# Patient Record
Sex: Female | Born: 1950 | Race: Black or African American | Hispanic: No
Health system: Southern US, Community
[De-identification: ages and names within clinical notes are randomized; demographics above are authoritative.]

## PROBLEM LIST (undated history)

## (undated) HISTORY — PX: ABDOMINAL HYSTERECTOMY: SHX81

---

## 2004-10-02 ENCOUNTER — Emergency Department (HOSPITAL_COMMUNITY): Admission: EM | Admit: 2004-10-02 | Discharge: 2004-10-02 | Payer: Self-pay | Admitting: Emergency Medicine

## 2018-06-27 ENCOUNTER — Emergency Department (HOSPITAL_COMMUNITY)
Admission: EM | Admit: 2018-06-27 | Discharge: 2018-06-27 | Disposition: A | Discharge status: Home / Self Care | Payer: Medicare Other | Attending: Emergency Medicine | Admitting: Emergency Medicine

## 2018-06-27 DIAGNOSIS — I10 Essential (primary) hypertension: Secondary | ICD-10-CM | POA: Diagnosis not present

## 2018-06-27 DIAGNOSIS — M109 Gout, unspecified: Secondary | ICD-10-CM | POA: Insufficient documentation

## 2018-06-27 DIAGNOSIS — M79674 Pain in right toe(s): Secondary | ICD-10-CM | POA: Diagnosis present

## 2018-06-27 MED ORDER — HYDROCODONE-ACETAMINOPHEN 5-325 MG PO TABS
1.0000 | ORAL_TABLET | Freq: Once | ORAL | Status: AC
  Administered 2018-06-27: 1 via ORAL
  Filled 2018-06-27: qty 1

## 2018-06-27 MED ORDER — PREDNISONE 20 MG PO TABS: ORAL_TABLET | ORAL | 0 refills | Status: AC

## 2018-06-27 MED ORDER — HYDROCODONE-ACETAMINOPHEN 5-325 MG PO TABS: 1.0000 | ORAL_TABLET | Freq: Four times a day (QID) | ORAL | 0 refills | Status: DC | PRN

## 2018-06-27 NOTE — ED Triage Notes (Signed)
Pt complaining of right foot pain, starting last night

## 2018-06-27 NOTE — ED Provider Notes (Signed)
[facility]-EMERGENCY DEPT Provider Note   CSN: [id number] Arrival date & time: 06/27/18  [id number]     History   Chief Complaint No chief complaint on file.   HPI Kara Poole is a 67 y.o. female.  HPI  67 year old female with a history of hypertension presents with acute right toe pain.  No trauma noted.  Started last night.  Patient's pain is rated as a 10/10.  The patient has noticed some swelling without color change.  No fevers.  Patient has had this once before and was diagnosed with gout.  States this feels exactly the same.  No known history of diabetes or kidney problems.  No past medical history on file.  There are no active problems to display for this patient.      OB History   None      Home Medications    Prior to Admission medications   Medication Sig Start Date End Date Taking? Authorizing Provider  HYDROcodone-acetaminophen (NORCO) 5-325 MG tablet Take 1 tablet by mouth every 6 (six) hours as needed for severe pain. 06/27/18   Pricilla LovelessGoldston, Niema Carrara, MD  predniSONE (DELTASONE) 20 MG tablet 3 tabs po day one, then 2 po daily x 4 days 06/27/18   Pricilla LovelessGoldston, Kary Colaizzi, MD    Family History No family history on file.  Social History Social History   Tobacco Use  . Smoking status: Not on file  Substance Use Topics  . Alcohol use: Not on file  . Drug use: Not on file     Allergies   Patient has no allergy information on record.   Review of Systems Review of Systems  Constitutional: Negative for fever.  Musculoskeletal: Positive for arthralgias and joint swelling.  Skin: Negative for color change.     Physical Exam Updated Vital Signs BP (!) 159/86   Pulse 66   Temp 98.5 F (36.9 C)   Resp 16   SpO2 100%   Physical Exam  Constitutional: She appears well-developed and well-nourished. No distress.  HENT:  Head: Normocephalic and atraumatic.  Right Ear: External ear normal.  Left Ear: External ear normal.  Nose: Nose  normal.  Eyes: Right eye exhibits no discharge. Left eye exhibits no discharge.  Cardiovascular: Normal rate and regular rhythm.  Pulses:      Dorsalis pedis pulses are 2+ on the right side, and 2+ on the left side.  Pulmonary/Chest: Effort normal.  Musculoskeletal:       Right ankle: She exhibits normal range of motion and no swelling. No tenderness.       Right foot: There is tenderness and swelling.       Feet:  Neurological: She is alert.  Skin: Skin is warm and dry. She is not diaphoretic. No erythema.  Nursing note and vitals reviewed.    ED Treatments / Results  Labs (all labs ordered are listed, but only abnormal results are displayed) Labs Reviewed - No data to display  EKG None  Radiology No results found.  Procedures Procedures (including critical care time)  Medications Ordered in ED Medications  HYDROcodone-acetaminophen (NORCO/VICODIN) 5-325 MG per tablet 1 tablet (has no administration in time range)     Initial Impression / Assessment and Plan / ED Course  I have reviewed the triage vital signs and the nursing notes.  Pertinent labs & imaging results that were available during my care of the patient were reviewed by me and considered in my medical decision making (see chart for details).  Presentation is consistent with gout which she has had before.  We discussed how this is primarily uric acid problem and will give information on trying to change diet.  She is noted to be hypertensive and does not have a PCP here yet.  She will need to follow-up with 1 both for follow-up of the gout as well as normal health maintenance.  However my suspicion for any acute trauma requiring x-ray is low given no reported trauma.  This is unlikely to be a septic joint given no erythema and good range of motion of the joint.  Given her age, I think NSAIDs would not be a good idea, as well as colchicine given no known renal baseline.  Treat with Norco for severe pain as  well as a prednisone burst.  Final Clinical Impressions(s) / ED Diagnoses   Final diagnoses:  Acute gout involving toe of right foot, unspecified cause    ED Discharge Orders         Ordered    HYDROcodone-acetaminophen (NORCO) 5-325 MG tablet  Every 6 hours PRN     06/27/18 0920    predniSONE (DELTASONE) 20 MG tablet     06/27/18 0920           Pricilla Loveless, MD 06/27/18 [phone]

## 2020-05-09 ENCOUNTER — Emergency Department (HOSPITAL_COMMUNITY): Payer: Medicare Other

## 2020-05-09 ENCOUNTER — Other Ambulatory Visit: Payer: Self-pay

## 2020-05-09 ENCOUNTER — Emergency Department (HOSPITAL_COMMUNITY)
Admission: EM | Admit: 2020-05-09 | Discharge: 2020-05-09 | Disposition: A | Discharge status: Home / Self Care | Payer: Medicare Other | Attending: Emergency Medicine | Admitting: Emergency Medicine

## 2020-05-09 DIAGNOSIS — M779 Enthesopathy, unspecified: Secondary | ICD-10-CM | POA: Insufficient documentation

## 2020-05-09 DIAGNOSIS — M6790 Unspecified disorder of synovium and tendon, unspecified site: Secondary | ICD-10-CM

## 2020-05-09 DIAGNOSIS — M25571 Pain in right ankle and joints of right foot: Secondary | ICD-10-CM | POA: Diagnosis not present

## 2020-05-09 DIAGNOSIS — M79671 Pain in right foot: Secondary | ICD-10-CM | POA: Diagnosis present

## 2020-05-09 MED ORDER — HYDROCODONE-ACETAMINOPHEN 5-325 MG PO TABS: 1.0000 | ORAL_TABLET | Freq: Four times a day (QID) | ORAL | 0 refills | Status: DC | PRN

## 2020-05-09 MED ORDER — IBUPROFEN 400 MG PO TABS: 400.0000 mg | ORAL_TABLET | Freq: Three times a day (TID) | ORAL | 0 refills | Status: DC | PRN

## 2020-05-09 MED ORDER — ACETAMINOPHEN ER 650 MG PO TBCR: 650.0000 mg | EXTENDED_RELEASE_TABLET | Freq: Three times a day (TID) | ORAL | 0 refills | Status: AC | PRN

## 2020-05-09 MED ORDER — OXYCODONE-ACETAMINOPHEN 5-325 MG PO TABS
1.0000 | ORAL_TABLET | Freq: Once | ORAL | Status: AC
  Administered 2020-05-09: 1 via ORAL
  Filled 2020-05-09: qty 1

## 2020-05-09 NOTE — ED Notes (Signed)
Ambulatory with crutches

## 2020-05-09 NOTE — ED Triage Notes (Signed)
Patient reports she has gout in her right foot and that the pain has gotten increasingly worse

## 2020-05-09 NOTE — Discharge Instructions (Addendum)
We saw in the ER for pain in your ankle.  It is unclear if you are having gout versus tendinitis.  Given your severe pain making it difficult to walk, we have placed you in a cam walker boot.  We are sending you home with strong pain medication that needs to be taken only if the pain is excruciating and not responding to Tylenol and ibuprofen.  Follow-up with the orthopedic doctors in 1 week.

## 2020-05-09 NOTE — ED Notes (Signed)
Patient called x2 for rooming

## 2020-05-09 NOTE — ED Provider Notes (Signed)
[facility]-EMERGENCY DEPT Provider Note   CSN: [id number] Arrival date & time: 05/09/20  1048     History Chief Complaint  Patient presents with  . Foot Pain    Right    Kara Poole is a 69 y.o. female.  HPI    69 year old female comes in a chief complaint of right foot and ankle pain.  Patient reports that her pain started 4 to 5 days ago, it is progressively gotten worse.  She is having severe pain every time she puts pressure on that side now, and therefore unable to walk without assistance.  Normally she is able to walk around without any assistance and is quite active.  She had history of gout in her knee earlier, and suspects that this is gout again.  She denies any nausea, vomiting, fevers, chills.  No trauma.   No past medical history on file.  There are no problems to display for this patient.     OB History   No obstetric history on file.     No family history on file.  Social History   Tobacco Use  . Smoking status: Not on file  Substance Use Topics  . Alcohol use: Not on file  . Drug use: Not on file    Home Medications Prior to Admission medications   Medication Sig Start Date End Date Taking? Authorizing Provider  acetaminophen (TYLENOL 8 HOUR) 650 MG CR tablet Take 1 tablet (650 mg total) by mouth every 8 (eight) hours as needed. 05/09/20   Derwood Kaplan, MD  HYDROcodone-acetaminophen (NORCO/VICODIN) 5-325 MG tablet Take 1 tablet by mouth every 6 (six) hours as needed for moderate pain or severe pain. 05/09/20   Fayrene Helper, PA-C  ibuprofen (ADVIL) 400 MG tablet Take 1 tablet (400 mg total) by mouth every 8 (eight) hours as needed. 05/09/20   Derwood Kaplan, MD  predniSONE (DELTASONE) 20 MG tablet 3 tabs po day one, then 2 po daily x 4 days 06/27/18   Pricilla Loveless, MD    Allergies    Patient has no allergy information on record.  Review of Systems   Review of Systems  Constitutional: Positive for activity change.  Negative for fever.  Musculoskeletal: Positive for arthralgias, gait problem and myalgias.  Skin: Negative for wound.  Allergic/Immunologic: Negative for immunocompromised state.    Physical Exam Updated Vital Signs BP (!) 179/80 (BP Location: Right Arm)   Pulse 89   Temp 99.3 F (37.4 C) (Oral)   Resp 16   SpO2 100%   Physical Exam Vitals and nursing note reviewed.  Constitutional:      Appearance: She is well-developed.  HENT:     Head: Atraumatic.  Cardiovascular:     Rate and Rhythm: Normal rate.  Pulmonary:     Effort: Pulmonary effort is normal.  Abdominal:     General: Bowel sounds are normal.  Musculoskeletal:        General: Swelling and tenderness present. No deformity.     Cervical back: Normal range of motion and neck supple.     Comments: Patient has tenderness over the right Achilles tendon insertion site.  She also has tenderness over her lateral and medial malleoli.  Positive tenderness with passive range of motion of the ankle.  No erythema or warmth to touch.  No tenderness over the calf, knee.  Skin:    General: Skin is warm and dry.  Neurological:     Mental Status: She is alert and oriented  to person, place, and time.     ED Results / Procedures / Treatments   Labs (all labs ordered are listed, but only abnormal results are displayed) Labs Reviewed - No data to display  EKG None  Radiology DG Ankle Complete Right  Result Date: 05/09/2020 CLINICAL DATA:  Ankle pain EXAM: RIGHT ANKLE - COMPLETE 3+ VIEW COMPARISON:  None. FINDINGS: No acute fracture or dislocation is noted. Degenerative changes of the tarsal bones are noted. Calcaneal spurring is seen. Mild soft tissue swelling is noted. IMPRESSION: Soft tissue swelling and degenerative change without acute abnormality. Electronically Signed   By: Alcide Clever M.D.   On: 05/09/2020 13:31    Procedures Procedures (including critical care time)  Medications Ordered in ED Medications    oxyCODONE-acetaminophen (PERCOCET/ROXICET) 5-325 MG per tablet 1 tablet (1 tablet Oral Given 05/09/20 1311)    ED Course  I have reviewed the triage vital signs and the nursing notes.  Pertinent labs & imaging results that were available during my care of the patient were reviewed by me and considered in my medical decision making (see chart for details).    MDM Rules/Calculators/A&P                          Patient comes in a chief complaint of ankle pain. Clinical suspicion is high for Achilles tendinopathy.  There is also tenderness over the ankle, that could be arthritis related.  Doubt gout or septic arthritis based on exam.  Patient also indicates that she is having the same type of pain in her left side, which makes arthropathy even less likely.  Patient was placed in a boot.  X-ray does not show any concerning findings.  Patient ambulating with the boot.  We will advised her to follow-up with orthopedic surgery.  She is seeing her PCP later this week as well,  which is helpful.  Final Clinical Impression(s) / ED Diagnoses Final diagnoses:  Acute right ankle pain  Tendinopathy    Rx / DC Orders ED Discharge Orders         Ordered    HYDROcodone-acetaminophen (NORCO/VICODIN) 5-325 MG tablet  Every 6 hours PRN     Discontinue  Reprint     05/09/20 1440    ibuprofen (ADVIL) 400 MG tablet  Every 8 hours PRN     Discontinue  Reprint     05/09/20 1440    acetaminophen (TYLENOL 8 HOUR) 650 MG CR tablet  Every 8 hours PRN     Discontinue  Reprint     05/09/20 1440           Derwood Kaplan, MD 05/09/20 1445

## 2021-05-16 ENCOUNTER — Encounter (HOSPITAL_COMMUNITY): Payer: Self-pay

## 2021-05-16 ENCOUNTER — Emergency Department (HOSPITAL_COMMUNITY): Payer: Medicare Other

## 2021-05-16 ENCOUNTER — Emergency Department (HOSPITAL_COMMUNITY)
Admission: EM | Admit: 2021-05-16 | Discharge: 2021-05-16 | Disposition: A | Discharge status: Home / Self Care | Payer: Medicare Other | Attending: Emergency Medicine | Admitting: Emergency Medicine

## 2021-05-16 ENCOUNTER — Other Ambulatory Visit: Payer: Self-pay

## 2021-05-16 DIAGNOSIS — Y9389 Activity, other specified: Secondary | ICD-10-CM | POA: Diagnosis not present

## 2021-05-16 DIAGNOSIS — M7021 Olecranon bursitis, right elbow: Secondary | ICD-10-CM | POA: Diagnosis not present

## 2021-05-16 DIAGNOSIS — M25521 Pain in right elbow: Secondary | ICD-10-CM | POA: Diagnosis present

## 2021-05-16 MED ORDER — HYDROCODONE-ACETAMINOPHEN 5-325 MG PO TABS: 1.0000 | ORAL_TABLET | Freq: Four times a day (QID) | ORAL | 0 refills | Status: AC | PRN

## 2021-05-16 MED ORDER — IBUPROFEN 400 MG PO TABS: 400.0000 mg | ORAL_TABLET | Freq: Three times a day (TID) | ORAL | 0 refills | Status: AC | PRN

## 2021-05-16 NOTE — ED Provider Notes (Signed)
[facility]-EMERGENCY DEPT Provider Note   CSN: [id number] Arrival date & time: 05/16/21  1635     History Chief Complaint  Patient presents with   Elbow Pain    Kara Poole is a 70 y.o. female.  HPI  Patient presents to the ED for evaluation of right elbow pain.  Patient states she has had the symptoms for a week or 2.  She initially went to an urgent care and they gave her a shot.  Patient states the elbow persists and is not getting any better.  Patient feels that the elbow was normal.  It hurts for her to move around.  She denies any fevers or chills.  No recent falls or injuries.  History reviewed. No pertinent past medical history.  There are no problems to display for this patient.   History reviewed. No pertinent surgical history.   OB History   No obstetric history on file.     History reviewed. No pertinent family history.     Home Medications Prior to Admission medications   Medication Sig Start Date End Date Taking? Authorizing Provider  acetaminophen (TYLENOL 8 HOUR) 650 MG CR tablet Take 1 tablet (650 mg total) by mouth every 8 (eight) hours as needed. 05/09/20   Derwood Kaplan, MD  HYDROcodone-acetaminophen (NORCO/VICODIN) 5-325 MG tablet Take 1 tablet by mouth every 6 (six) hours as needed for moderate pain or severe pain. 05/16/21   Linwood Dibbles, MD  ibuprofen (ADVIL) 400 MG tablet Take 1 tablet (400 mg total) by mouth every 8 (eight) hours as needed. 05/16/21   Linwood Dibbles, MD  predniSONE (DELTASONE) 20 MG tablet 3 tabs po day one, then 2 po daily x 4 days 06/27/18   Pricilla Loveless, MD    Allergies    Patient has no known allergies.  Review of Systems   Review of Systems  All other systems reviewed and are negative.  Physical Exam Updated Vital Signs BP (!) 177/88 (BP Location: Left Arm)   Pulse 78   Temp 98.8 F (37.1 C) (Oral)   Resp 16   SpO2 100%   Physical Exam Vitals and nursing note reviewed.  Constitutional:       General: She is not in acute distress.    Appearance: She is well-developed.  HENT:     Head: Normocephalic and atraumatic.     Right Ear: External ear normal.     Left Ear: External ear normal.  Eyes:     General: No scleral icterus.       Right eye: No discharge.        Left eye: No discharge.     Conjunctiva/sclera: Conjunctivae normal.  Neck:     Trachea: No tracheal deviation.  Cardiovascular:     Rate and Rhythm: Normal rate.  Pulmonary:     Effort: Pulmonary effort is normal. No respiratory distress.     Breath sounds: No stridor.  Abdominal:     General: There is no distension.  Musculoskeletal:        General: No swelling or deformity.     Right elbow: No swelling or deformity. Decreased range of motion. Tenderness present.     Cervical back: Neck supple.  Skin:    General: Skin is warm and dry.     Findings: No rash.  Neurological:     Mental Status: She is alert.     Cranial Nerves: Cranial nerve deficit: no gross deficits.    ED Results / Procedures /  Treatments   Labs (all labs ordered are listed, but only abnormal results are displayed) Labs Reviewed - No data to display  EKG None  Radiology DG Elbow Complete Right  Result Date: 05/16/2021 CLINICAL DATA:  Acute right elbow pain.  No known injury. EXAM: RIGHT ELBOW - COMPLETE 3+ VIEW COMPARISON:  None. FINDINGS: There is no evidence of fracture, dislocation, or joint effusion. There is no evidence of arthropathy or other focal bone abnormality. Soft tissues are unremarkable. IMPRESSION: Negative. Electronically Signed   By: Lupita Raider M.D.   On: 05/16/2021 19:23    Procedures Procedures   Medications Ordered in ED Medications - No data to display  ED Course  I have reviewed the triage vital signs and the nursing notes.  Pertinent labs & imaging results that were available during my care of the patient were reviewed by me and considered in my medical decision making (see chart for  details).  Clinical Course as of 05/16/21 2020  Thu May 16, 2021  2005 X-ray without acute abnormality [JK]    Clinical Course User Index [JK] Linwood Dibbles, MD   MDM Rules/Calculators/A&P                           Patient presented to the ED for evaluation of right elbow pain.  Patient denies any recent injuries.  X-ray does not show any signs of bony abnormality.  No large effusion in the joint.  Suspect she is having issues with olecranon bursitis.  I doubt septic arthritis.  We will try pain medications and outpatient follow-up with orthopedics. Final Clinical Impression(s) / ED Diagnoses Final diagnoses:  Olecranon bursitis of right elbow    Rx / DC Orders ED Discharge Orders          Ordered    HYDROcodone-acetaminophen (NORCO/VICODIN) 5-325 MG tablet  Every 6 hours PRN        05/16/21 2017    ibuprofen (ADVIL) 400 MG tablet  Every 8 hours PRN        05/16/21 2017             Linwood Dibbles, MD 05/16/21 2021

## 2021-05-16 NOTE — ED Provider Notes (Signed)
Emergency Medicine Provider Triage Evaluation Note  Kara Poole , a 70 y.o. female  was evaluated in triage.  Pt complains of right elbow pain.  Review of Systems  Positive: Elbow pain Negative: fever  Physical Exam  BP (!) 177/88 (BP Location: Left Arm)   Pulse 78   Temp 98.8 F (37.1 C) (Oral)   Resp 16   SpO2 100%  Gen:   Awake, no distress   Resp:  Normal effort  MSK:   Moves extremities without difficulty  Other:  Ttp, erythema and warmth to the right elbow  Medical Decision Making  Medically screening exam initiated at 4:56 PM.  Appropriate orders placed.  Kara Poole was informed that the remainder of the evaluation will be completed by another provider, this initial triage assessment does not replace that evaluation, and the importance of remaining in the ED until their evaluation is complete.     Rayne Du 05/16/21 1656    Glendora Score, MD 05/16/21 1807

## 2021-05-16 NOTE — ED Triage Notes (Signed)
Pt reports right elbow pain x 2 weeks. 1 week ago patient went to pcp and received injection for pain and was told to take tylenol. Patient reports no improvement in pain.

## 2021-05-16 NOTE — Discharge Instructions (Addendum)
Take medication as needed for pain.  Follow-up with the orthopedic doctor for further evaluation of your elbow pain

## 2022-03-04 ENCOUNTER — Other Ambulatory Visit (HOSPITAL_COMMUNITY): Payer: Self-pay | Admitting: Family Medicine

## 2022-03-04 DIAGNOSIS — R079 Chest pain, unspecified: Secondary | ICD-10-CM

## 2022-03-14 IMAGING — CR DG ELBOW COMPLETE 3+V*R*
4 series · 4 of 4 positions shown · non-contrast
Comparison: None.

CLINICAL DATA: Acute right elbow pain.  No known injury.

EXAM:
RIGHT ELBOW - COMPLETE 3+ VIEW

[x elbow lat right]
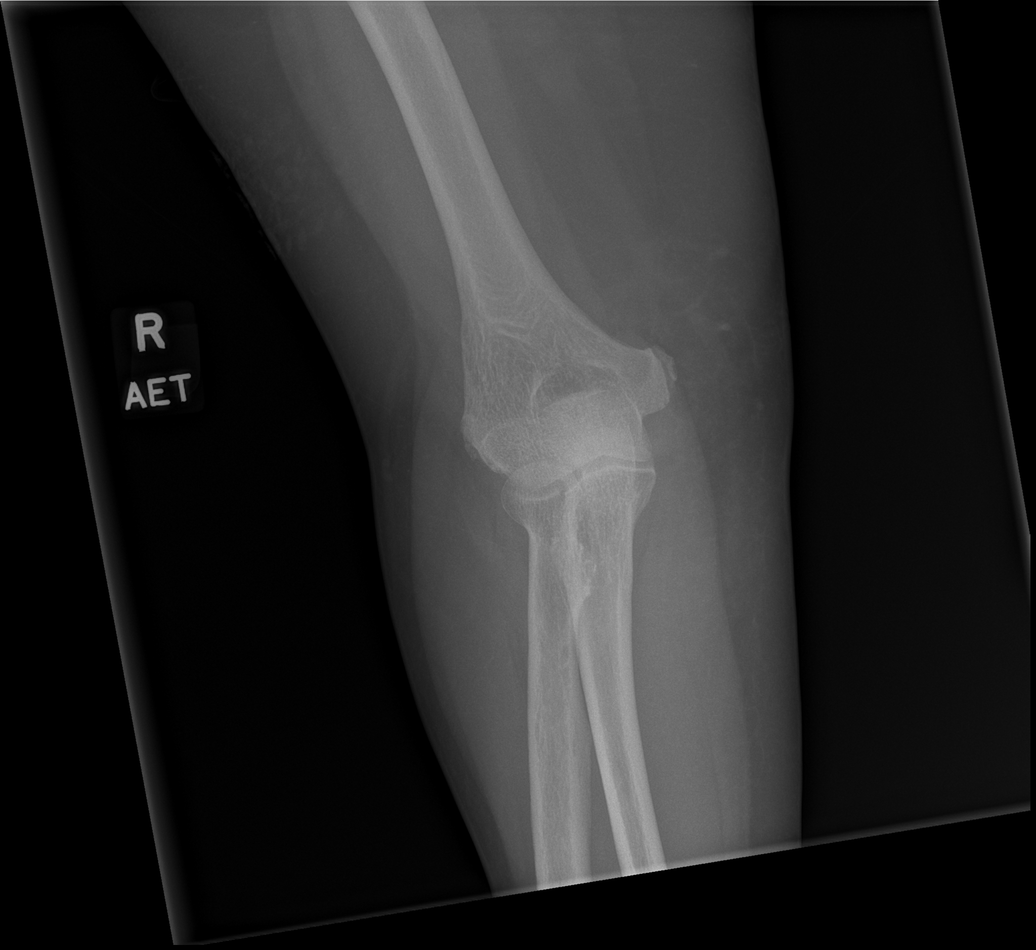

[x elbow obl right (1 of 2)]
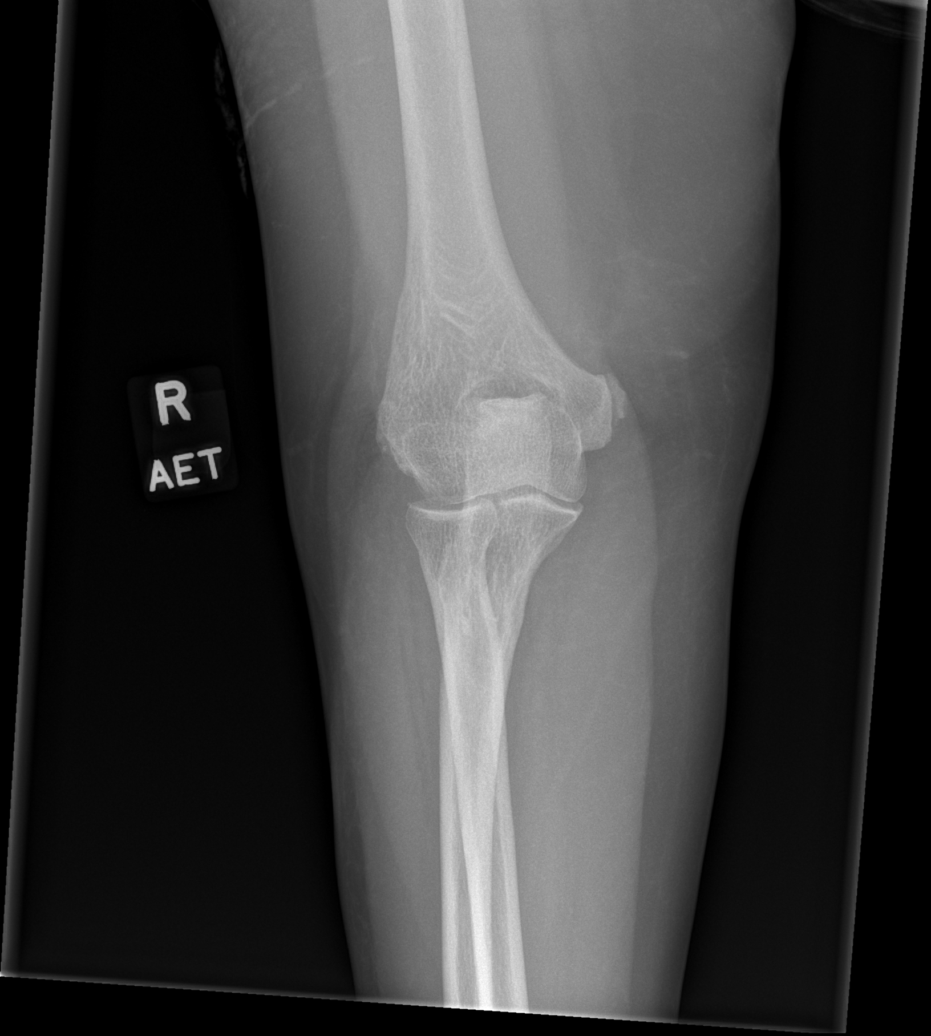

[x elbow obl right (2 of 2)]
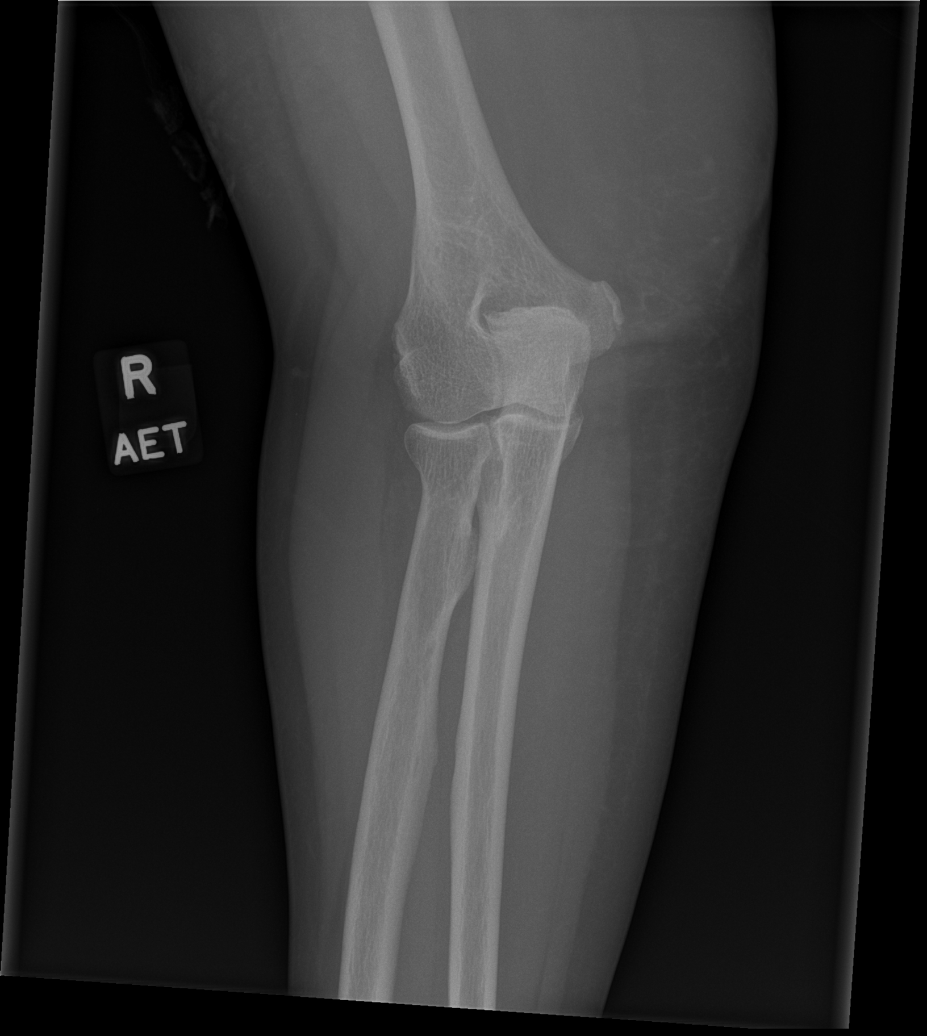

[x elbow right 0-3yrs]
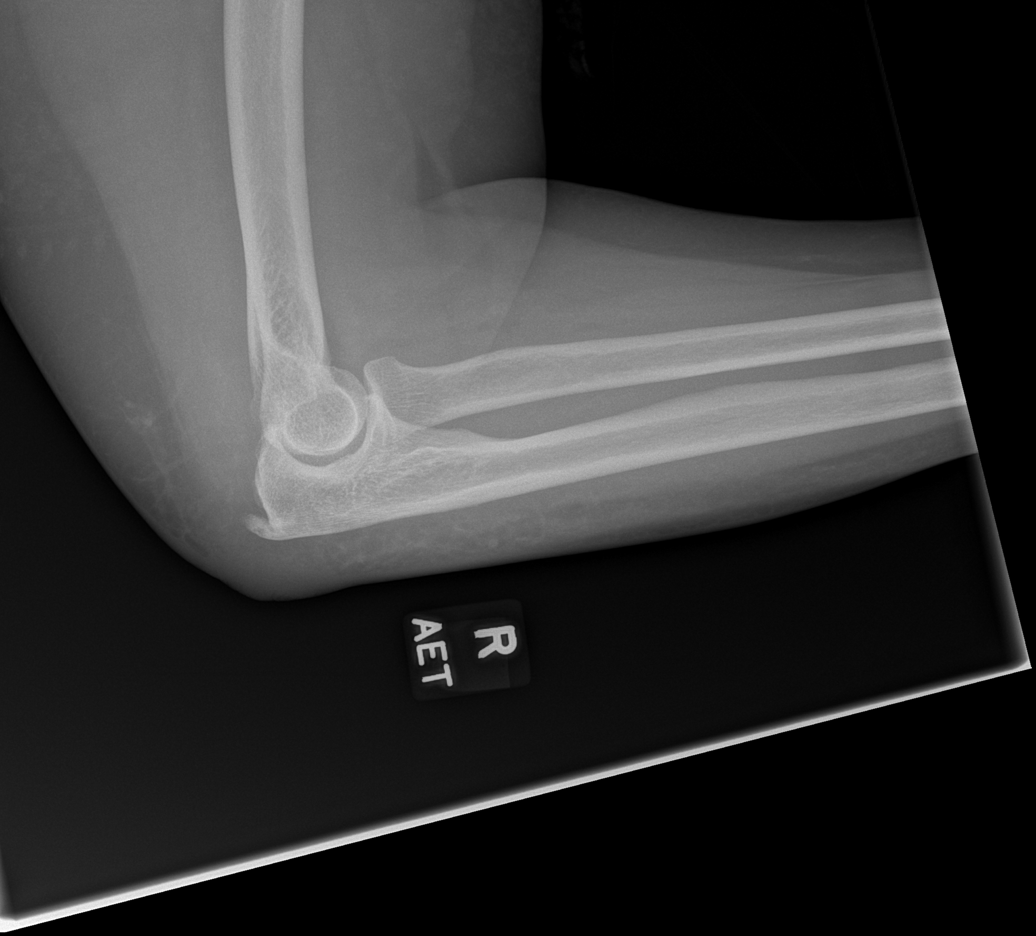

[4 of 4 positions shown; findings below may reference images not displayed]

FINDINGS: There is no evidence of fracture, dislocation, or joint effusion.
There is no evidence of arthropathy or other focal bone abnormality.
Soft tissues are unremarkable.
IMPRESSION: Negative.

## 2022-04-25 ENCOUNTER — Telehealth (HOSPITAL_COMMUNITY): Payer: Self-pay | Admitting: *Deleted

## 2022-04-25 NOTE — Telephone Encounter (Signed)
Reaching out to patient to offer assistance regarding upcoming cardiac imaging study; pt verbalizes understanding of appt date/time, parking situation and where to check in, pre-test NPO status; name and call back number provided for further questions should they arise  Larey Brick RN Navigator Cardiac Imaging [facility] [phone] office [phone] cell  Patient to take her daily medications.  She is aware to arrive at 10:30am.

## 2022-04-28 ENCOUNTER — Ambulatory Visit (HOSPITAL_COMMUNITY)
Admission: RE | Admit: 2022-04-28 | Discharge: 2022-04-28 | Disposition: A | Discharge status: Home / Self Care | Payer: Medicare Other | Source: Ambulatory Visit | Attending: Family Medicine | Admitting: Family Medicine

## 2022-04-28 DIAGNOSIS — I2510 Atherosclerotic heart disease of native coronary artery without angina pectoris: Secondary | ICD-10-CM | POA: Diagnosis not present

## 2022-04-28 DIAGNOSIS — K449 Diaphragmatic hernia without obstruction or gangrene: Secondary | ICD-10-CM | POA: Insufficient documentation

## 2022-04-28 DIAGNOSIS — I700 Atherosclerosis of aorta: Secondary | ICD-10-CM | POA: Diagnosis not present

## 2022-04-28 DIAGNOSIS — R079 Chest pain, unspecified: Secondary | ICD-10-CM | POA: Insufficient documentation

## 2022-04-28 MED ORDER — NITROGLYCERIN 0.4 MG SL SUBL
SUBLINGUAL_TABLET | SUBLINGUAL | Status: AC
  Filled 2022-04-28: qty 2

## 2022-04-28 MED ORDER — IOHEXOL 350 MG/ML SOLN
100.0000 mL | Freq: Once | INTRAVENOUS | Status: AC | PRN
  Administered 2022-04-28: 100 mL via INTRAVENOUS

## 2022-04-28 MED ORDER — NITROGLYCERIN 0.4 MG SL SUBL
0.8000 mg | SUBLINGUAL_TABLET | Freq: Once | SUBLINGUAL | Status: AC
  Administered 2022-04-28: 0.8 mg via SUBLINGUAL

## 2024-09-23 ENCOUNTER — Other Ambulatory Visit: Payer: Self-pay

## 2024-09-23 ENCOUNTER — Encounter (HOSPITAL_COMMUNITY): Payer: Self-pay

## 2024-09-23 ENCOUNTER — Emergency Department (HOSPITAL_COMMUNITY)
Admission: EM | Admit: 2024-09-23 | Discharge: 2024-09-23 | Disposition: A | Discharge status: Home / Self Care | Attending: Emergency Medicine | Admitting: Emergency Medicine

## 2024-09-23 DIAGNOSIS — K644 Residual hemorrhoidal skin tags: Secondary | ICD-10-CM | POA: Diagnosis not present

## 2024-09-23 DIAGNOSIS — Z91040 Latex allergy status: Secondary | ICD-10-CM | POA: Diagnosis not present

## 2024-09-23 DIAGNOSIS — N189 Chronic kidney disease, unspecified: Secondary | ICD-10-CM | POA: Insufficient documentation

## 2024-09-23 DIAGNOSIS — I129 Hypertensive chronic kidney disease with stage 1 through stage 4 chronic kidney disease, or unspecified chronic kidney disease: Secondary | ICD-10-CM | POA: Diagnosis not present

## 2024-09-23 DIAGNOSIS — R748 Abnormal levels of other serum enzymes: Secondary | ICD-10-CM | POA: Insufficient documentation

## 2024-09-23 DIAGNOSIS — K625 Hemorrhage of anus and rectum: Secondary | ICD-10-CM | POA: Diagnosis present

## 2024-09-23 LAB — COMPREHENSIVE METABOLIC PANEL WITH GFR
ALT: 24 U/L (ref 0–44)
AST: 30 U/L (ref 15–41)
Albumin: 4.4 g/dL (ref 3.5–5.0)
Alkaline Phosphatase: 134 U/L — ABNORMAL HIGH (ref 38–126)
Anion gap: 10 (ref 5–15)
BUN: 23 mg/dL (ref 8–23)
CO2: 24 mmol/L (ref 22–32)
Calcium: 10.4 mg/dL — ABNORMAL HIGH (ref 8.9–10.3)
Chloride: 108 mmol/L (ref 98–111)
Creatinine, Ser: 1.35 mg/dL — ABNORMAL HIGH (ref 0.44–1.00)
GFR, Estimated: 41 mL/min — ABNORMAL LOW
Glucose, Bld: 108 mg/dL — ABNORMAL HIGH (ref 70–99)
Potassium: 4 mmol/L (ref 3.5–5.1)
Sodium: 141 mmol/L (ref 135–145)
Total Bilirubin: 0.4 mg/dL (ref 0.0–1.2)
Total Protein: 7.5 g/dL (ref 6.5–8.1)

## 2024-09-23 LAB — CBC
HCT: 37.6 % (ref 36.0–46.0)
Hemoglobin: 12.2 g/dL (ref 12.0–15.0)
MCH: 29.7 pg (ref 26.0–34.0)
MCHC: 32.4 g/dL (ref 30.0–36.0)
MCV: 91.5 fL (ref 80.0–100.0)
Platelets: 213 K/uL (ref 150–400)
RBC: 4.11 MIL/uL (ref 3.87–5.11)
RDW: 12.7 % (ref 11.5–15.5)
WBC: 4.9 K/uL (ref 4.0–10.5)
nRBC: 0 % (ref 0.0–0.2)

## 2024-09-23 LAB — TYPE AND SCREEN
ABO/RH(D): O POS
Antibody Screen: NEGATIVE

## 2024-09-23 MED ORDER — HYDROCORTISONE (PERIANAL) 2.5 % EX CREA: 1.0000 | TOPICAL_CREAM | Freq: Two times a day (BID) | CUTANEOUS | 0 refills | Status: AC

## 2024-09-23 NOTE — ED Provider Notes (Signed)
 " [place] [facility] Provider Note   CSN: [id number] Arrival date & time: 09/23/24  1403     Patient presents with: Rectal Bleeding   Kara Poole is a 73 y.o. female.   73 year old female presenting with rectal bleeding.  Patient notes that she was constipated earlier in the week and took laxatives which resulted in a bowel movement on Tuesday, since that time she has noted some pinkish or bright reddish rectal bleeding at times after a bowel movement since then, noting blood on the toilet paper but not mixed in with the stool/in the toilet bowl.  She is not on blood thinners.  She reports that her anus is sore, even when she has not had a bowel movement.  Denies fever.  Reports her last colonoscopy was approximately 8 years ago and she is due for a repeat colonoscopy in 2026.   Rectal Bleeding      Prior to Admission medications  Medication Sig Start Date End Date Taking? Authorizing Provider  acetaminophen  (TYLENOL  8 HOUR) 650 MG CR tablet Take 1 tablet (650 mg total) by mouth every 8 (eight) hours as needed. 05/09/20   Charlyn Sora, MD  HYDROcodone -acetaminophen  (NORCO/VICODIN) 5-325 MG tablet Take 1 tablet by mouth every 6 (six) hours as needed for moderate pain or severe pain. Patient not taking: Reported on 04/28/2022 05/16/21   Randol Simmonds, MD  ibuprofen  (ADVIL ) 400 MG tablet Take 1 tablet (400 mg total) by mouth every 8 (eight) hours as needed. 05/16/21   Randol Simmonds, MD  predniSONE  (DELTASONE ) 20 MG tablet 3 tabs po day one, then 2 po daily x 4 days Patient not taking: Reported on 04/28/2022 06/27/18   Freddi Hamilton, MD    Allergies: Latex and Penicillins    Review of Systems  Gastrointestinal:  Positive for hematochezia.    Updated Vital Signs  Vitals:   09/23/24 1410 09/23/24 1729 09/23/24 1730  BP: (!) 154/72  (!) 150/70  Pulse: 63 (!) 51 (!) 51  Resp: 19  18  Temp: 98.8 F (37.1 C) 97.8 F (36.6 C)   TempSrc:  Oral Oral   SpO2: 100% 99% 100%  Weight: 86.2 kg    Height: 5' 3 (1.6 m)       Physical Exam Vitals and nursing note reviewed.  HENT:     Head: Normocephalic.  Eyes:     Extraocular Movements: Extraocular movements intact.  Cardiovascular:     Rate and Rhythm: Normal rate.  Pulmonary:     Effort: Pulmonary effort is normal.  Genitourinary:     Comments: Rectal exam performed with NT Julianna at the bedside as chaperone  Mildly tender non-thrombosed external hemorrhoid noted, no appreciable rectal bleeding on exam. DRE deferred.    Musculoskeletal:     Cervical back: Normal range of motion.     Comments: Moves all extremities spontaneously without difficulty  Skin:    General: Skin is warm and dry.  Neurological:     Mental Status: She is alert and oriented to person, place, and time.     (all labs ordered are listed, but only abnormal results are displayed) Labs Reviewed  COMPREHENSIVE METABOLIC PANEL WITH GFR - Abnormal; Notable for the following components:      Result Value   Glucose, Bld 108 (*)    Creatinine, Ser 1.35 (*)    Calcium 10.4 (*)    Alkaline Phosphatase 134 (*)    GFR, Estimated 41 (*)    All  other components within normal limits  CBC  POC OCCULT BLOOD, ED  TYPE AND SCREEN  ABO/RH    EKG: None  Radiology: No results found.   Procedures   Medications Ordered in the ED - No data to display                                  Medical Decision Making This patient presents to the ED for concern of rectal bleeding, this involves an extensive number of treatment options, and is a complaint that carries with it a high risk of complications and morbidity.  The differential diagnosis includes external hemorrhoids, internal hemorrhoids, diverticular bleeding, anal fissure, other upper/lower GI source   Co morbidities that complicate the patient evaluation  Hypertension, CKD   Additional history obtained:  Additional history obtained from  record review External records from outside source obtained and reviewed including prior ED note   Lab Tests:  I Ordered, and personally interpreted labs.  The pertinent results include: CBC within normal limits, hemoglobin of 12.2 with no baseline available for comparison.  CMP notable for creatinine of 1.35, mildly elevated alkaline phosphatase at 134, no baseline available for comparison.    Cardiac Monitoring: / EKG:  The patient was maintained on a cardiac monitor.  I personally viewed and interpreted the cardiac monitored which showed an underlying rhythm of: sinus bradycardia   Problem List / ED Course / Critical interventions / Medication management I have reviewed the patients home medicines and have made adjustments as needed    Test / Admission - Considered:  Physical exam is notable as above, nonthrombosed mildly tender external hemorrhoid noted on exam, DRE deferred.  I suspect this is the source of patient's rectal bleeding, as her rectal bleeding began after a bout of constipation resulting in increased straining while attempting to have a bowel movement earlier this week.  Patient is not on blood thinners, her hemoglobin is within normal limits.  Patient does have an elevated creatinine today however she does have a documented history of CKD, I have no baseline for comparison unfortunately.  Patient's pain is well-controlled, I suspect that she will do well with conservative measures like hydrocortisone and sitz bath's.  I have provided her with contact information for gastroenterologist to schedule follow-up.  Return precautions discussed, patient voiced understanding is in agreement this plan, she is appropriate for discharge at this time.  Staffed with Dr. Ellouise  Amount and/or Complexity of Data Reviewed Labs: ordered.  Risk Prescription drug management.        Final diagnoses:  External hemorrhoid    ED Discharge Orders          Ordered     hydrocortisone (ANUSOL-HC) 2.5 % rectal cream  2 times daily        09/23/24 1844               Glendia Rocky SAILOR, PA-C 09/23/24 1847    Kingsley, Victoria K, DO 09/23/24 2040  "

## 2024-09-23 NOTE — ED Triage Notes (Signed)
 Patient said on Monday she was constipated. Took some laxatives and said it blew her out. After started having rectal pain. Next morning she had blood on the toilet paper after wiping. Said it is pink in color.

## 2024-09-23 NOTE — Discharge Instructions (Addendum)
 Start hydrocortisone cream and apply rectally twice daily.  Continue to use a stool softener, like Colace, daily, as constipation will only contribute to worsening of your symptoms.  Continue sitz bath's for management of rectal discomfort.  I provided you with contact information for a gastroenterologist, please contact their office to schedule follow-up in regard to your external hemorrhoid.  Follow-up with your PCP as needed.  Return to the emergency department if your symptoms worsen.
